# Patient Record
Sex: Male | Born: 1990 | Race: White | Hispanic: No | Marital: Single | Smoking: Never smoker
Health system: Southern US, Community
[De-identification: ages and names within clinical notes are randomized; demographics above are authoritative.]

---
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
## 2010-02-17 ENCOUNTER — Other Ambulatory Visit: Payer: Self-pay | Admitting: Otolaryngology

## 2010-02-17 DIAGNOSIS — R430 Anosmia: Secondary | ICD-10-CM

## 2010-02-21 ENCOUNTER — Ambulatory Visit
Admission: RE | Admit: 2010-02-21 | Discharge: 2010-02-21 | Disposition: A | Discharge status: Home / Self Care | Payer: 59 | Source: Ambulatory Visit | Attending: Otolaryngology | Admitting: Otolaryngology

## 2010-02-21 DIAGNOSIS — R430 Anosmia: Secondary | ICD-10-CM

## 2010-02-21 MED ORDER — GADOBENATE DIMEGLUMINE 529 MG/ML IV SOLN
18.0000 mL | Freq: Once | INTRAVENOUS | Status: AC | PRN
  Administered 2010-02-21: 18 mL via INTRAVENOUS

## 2010-05-18 ENCOUNTER — Emergency Department (HOSPITAL_COMMUNITY): Payer: 59

## 2010-05-18 ENCOUNTER — Emergency Department (HOSPITAL_COMMUNITY)
Admission: EM | Admit: 2010-05-18 | Discharge: 2010-05-18 | Disposition: A | Discharge status: Home / Self Care | Payer: 59 | Attending: Emergency Medicine | Admitting: Emergency Medicine

## 2010-05-18 DIAGNOSIS — IMO0002 Reserved for concepts with insufficient information to code with codable children: Secondary | ICD-10-CM | POA: Insufficient documentation

## 2010-05-18 DIAGNOSIS — R1011 Right upper quadrant pain: Secondary | ICD-10-CM | POA: Insufficient documentation

## 2010-05-18 DIAGNOSIS — Y929 Unspecified place or not applicable: Secondary | ICD-10-CM | POA: Insufficient documentation

## 2010-05-18 DIAGNOSIS — R071 Chest pain on breathing: Secondary | ICD-10-CM | POA: Insufficient documentation

## 2010-05-18 DIAGNOSIS — F848 Other pervasive developmental disorders: Secondary | ICD-10-CM | POA: Insufficient documentation

## 2010-05-18 DIAGNOSIS — Z79899 Other long term (current) drug therapy: Secondary | ICD-10-CM | POA: Insufficient documentation

## 2010-05-18 DIAGNOSIS — F988 Other specified behavioral and emotional disorders with onset usually occurring in childhood and adolescence: Secondary | ICD-10-CM | POA: Insufficient documentation

## 2010-05-18 MED ORDER — IOHEXOL 300 MG/ML  SOLN
100.0000 mL | Freq: Once | INTRAMUSCULAR | Status: AC | PRN
  Administered 2010-05-18: 100 mL via INTRAVENOUS
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
## 2016-03-15 ENCOUNTER — Ambulatory Visit (INDEPENDENT_AMBULATORY_CARE_PROVIDER_SITE_OTHER): Payer: Self-pay

## 2016-03-15 ENCOUNTER — Ambulatory Visit (INDEPENDENT_AMBULATORY_CARE_PROVIDER_SITE_OTHER): Payer: BLUE CROSS/BLUE SHIELD | Admitting: Orthopedic Surgery

## 2016-03-15 ENCOUNTER — Encounter (INDEPENDENT_AMBULATORY_CARE_PROVIDER_SITE_OTHER): Payer: Self-pay | Admitting: Orthopedic Surgery

## 2016-03-15 VITALS — Ht 68.0 in

## 2016-03-15 DIAGNOSIS — M25572 Pain in left ankle and joints of left foot: Secondary | ICD-10-CM | POA: Diagnosis not present

## 2016-03-15 NOTE — Progress Notes (Signed)
   Office Visit Note   Patient: Dustin Green           Date of Birth: 04/26/1990           MRN: [id number] Visit Date: 03/15/2016              Requested by: No referring provider defined for this encounter. PCP: Kaleen MaskELKINS,WILSON OLIVER, MD  Chief Complaint  Patient presents with  . Left Ankle - Pain    Pt states twist injury about 6 months ago    HPI: Pt complains of a almost 6 month history of left ankle pain. He states that this was due to a twist injury in which he states he landed on the ankle and it "bent in a unnatural way". Patient complains of alternating medial and lateral side ankle pain and that it is painful with walking and going up on his toes. He is full weight bearing and in a regular shoe today. [name], RMA  Patient is a 26 year old male seen today for initial evaluation of left ankle pain. States fell down the stairs and twisted his ankle many months ago. Has pain from time to time. No mechanical symptoms. Medial > lateral. No pain today. No difficulty with ambulation.    Assessment & Plan: Visit Diagnoses: No diagnosis found.  Plan: Given an ASO for ambulation. May use ice and ibu for pain. Follow up in office in 4 weeks if continued pain.   Follow-Up Instructions: No Follow-up on file.   Physical Exam  Constitutional: Appears well-developed.  Head: Normocephalic.  Eyes: EOM are normal.  Neck: Normal range of motion.  Cardiovascular: Normal rate.   Pulmonary/Chest: Effort normal.  Neurological: Is alert.  Skin: Skin is warm.  Psychiatric: Has a normal mood and affect. Steady gait. Left Ankle Exam  Swelling: none  Tenderness  The patient is experiencing tenderness in the CF.   Range of Motion  The patient has normal left ankle ROM.   Muscle Strength  The patient has normal left ankle strength.  Tests  Anterior drawer: negative Varus tilt: negative  Other  Erythema: absent Pulse: present  Comments:  No difficulty with single limb  heel raise.       Imaging: No results found.  Labs: No results found for: HGBA1C, ESRSEDRATE, CRP, LABURIC, REPTSTATUS, GRAMSTAIN, CULT, LABORGA  Orders:  No orders of the defined types were placed in this encounter.  No orders of the defined types were placed in this encounter.    Procedures: No procedures performed  Clinical Data: No additional findings.  Subjective: Review of Systems  Constitutional: Negative for chills and fever.  Musculoskeletal: Positive for myalgias. Negative for arthralgias and joint swelling.    Objective: Vital Signs: Ht 5\' 8"  (1.727 m)   Specialty Comments:  No specialty comments available.  PMFS History: There are no active problems to display for this patient.  No past medical history on file.  No family history on file.  No past surgical history on file. Social History   Occupational History  . Not on file.   Social History Main Topics  . Smoking status: Never Smoker  . Smokeless tobacco: Never Used  . Alcohol use Not on file  . Drug use: Unknown  . Sexual activity: Not on file

## 2016-03-22 ENCOUNTER — Ambulatory Visit (INDEPENDENT_AMBULATORY_CARE_PROVIDER_SITE_OTHER): Payer: Self-pay | Admitting: Orthopedic Surgery

## 2020-12-07 ENCOUNTER — Encounter (HOSPITAL_COMMUNITY): Payer: Self-pay

## 2020-12-07 ENCOUNTER — Ambulatory Visit (HOSPITAL_COMMUNITY)
Admission: EM | Admit: 2020-12-07 | Discharge: 2020-12-07 | Disposition: A | Discharge status: Home / Self Care | Payer: Medicaid Other | Attending: Internal Medicine | Admitting: Internal Medicine

## 2020-12-07 DIAGNOSIS — S0502XA Injury of conjunctiva and corneal abrasion without foreign body, left eye, initial encounter: Secondary | ICD-10-CM | POA: Diagnosis not present

## 2020-12-07 MED ORDER — TETRACAINE HCL 0.5 % OP SOLN
OPHTHALMIC | Status: AC
  Filled 2020-12-07: qty 4

## 2020-12-07 MED ORDER — ERYTHROMYCIN 5 MG/GM OP OINT: TOPICAL_OINTMENT | Freq: Three times a day (TID) | OPHTHALMIC | 0 refills | Status: AC

## 2020-12-07 MED ORDER — OLOPATADINE HCL 0.1 % OP SOLN: 1.0000 [drp] | Freq: Two times a day (BID) | OPHTHALMIC | 0 refills | Status: AC

## 2020-12-07 NOTE — ED Triage Notes (Signed)
Patient complains of eye pain with burning.

## 2020-12-07 NOTE — ED Provider Notes (Signed)
[facility]    CSN: [id number] Arrival date & time: 12/07/20  1445      History   Chief Complaint Chief Complaint  Patient presents with   Eye Pain    Eye pain x 2 days    HPI Dustin Green is a 30 y.o. male comes to the urgent care with a couple of days history of eye burning.  Patient's symptoms started abruptly in progressively got worse.  Patient endorses light sensitivity in the left eye.  No blurry vision.  No trauma to the eye.  Patient has some itching in the eye and has been rubbing his eye.  No contact lens use.Marland Kitchen   HPI  History reviewed. No pertinent past medical history.  There are no problems to display for this patient.   History reviewed. No pertinent surgical history.     Home Medications    Prior to Admission medications   Medication Sig Start Date End Date Taking? Authorizing Provider  erythromycin ophthalmic ointment Place into the left eye 3 (three) times daily for 5 days. Place a 1/2 inch ribbon of ointment into the lower eyelid. 12/07/20 12/12/20 Yes Zacari Radick, Britta Mccreedy, MD  olopatadine (PATANOL) 0.1 % ophthalmic solution Place 1 drop into both eyes 2 (two) times daily. 12/07/20  Yes Evony Rezek, Britta Mccreedy, MD    Family History History reviewed. No pertinent family history.  Social History Social History   Tobacco Use   Smoking status: Never   Smokeless tobacco: Never     Allergies   Patient has no known allergies.   Review of Systems Review of Systems  Eyes:  Positive for photophobia, pain, discharge, redness and itching. Negative for visual disturbance.  Respiratory: Negative.      Physical Exam Triage Vital Signs ED Triage Vitals  Enc Vitals Group     BP 12/07/20 1559 (!) 119/92     Pulse Rate 12/07/20 1559 (!) 110     Resp 12/07/20 1559 18     Temp 12/07/20 1559 98.6 F (37 C)     Temp Source 12/07/20 1559 Oral     SpO2 12/07/20 1559 100 %     Weight --      Height --      Head Circumference --      Peak Flow --       Pain Score 12/07/20 1600 10     Pain Loc --      Pain Edu? --      Excl. in GC? --    No data found.  Updated Vital Signs BP (!) 119/92 (BP Location: Left Arm)   Pulse (!) 110   Temp 98.6 F (37 C) (Oral)   Resp 18   SpO2 100%   Visual Acuity Right Eye Distance:   Left Eye Distance:   Bilateral Distance:    Right Eye Near:   Left Eye Near:    Bilateral Near:     Physical Exam Vitals and nursing note reviewed.  Constitutional:      General: He is not in acute distress.    Appearance: He is not ill-appearing.  HENT:     Right Ear: Tympanic membrane normal.     Left Ear: Tympanic membrane normal.  Eyes:     General:        Left eye: Discharge present.    Extraocular Movements: Extraocular movements intact.     Pupils: Pupils are equal, round, and reactive to light.     Comments: Erythema of  the left eye.  Cardiovascular:     Rate and Rhythm: Normal rate and regular rhythm.  Neurological:     Mental Status: He is alert.     UC Treatments / Results  Labs (all labs ordered are listed, but only abnormal results are displayed) Labs Reviewed - No data to display  EKG   Radiology No results found.  Procedures Procedures (including critical care time)  Medications Ordered in UC Medications - No data to display  Initial Impression / Assessment and Plan / UC Course  I have reviewed the triage vital signs and the nursing notes.  Pertinent labs & imaging results that were available during my care of the patient were reviewed by me and considered in my medical decision making (see chart for details).     1.  Abrasion of the left cornea: Fluorescein stain is positive for 1 mm corneal abrasion in the central part of the eye. Mild conjunctival erythema Erythromycin eye ointment twice daily for 5 days Patanol eyedrops twice daily If symptoms worsen please follow-up with ophthalmology for further evaluation. Final Clinical Impressions(s) / UC Diagnoses    Final diagnoses:  Abrasion of left cornea, initial encounter     Discharge Instructions      Please use medications as prescribed Return to urgent care if symptoms worsen.     ED Prescriptions     Medication Sig Dispense Auth. Provider   olopatadine (PATANOL) 0.1 % ophthalmic solution Place 1 drop into both eyes 2 (two) times daily. 5 mL Jaree Dwight, Myrene Galas, MD   erythromycin ophthalmic ointment Place into the left eye 3 (three) times daily for 5 days. Place a 1/2 inch ribbon of ointment into the lower eyelid. 3.5 g Meria Crilly, Myrene Galas, MD      PDMP not reviewed this encounter.   Chase Picket, MD 12/11/20 [phone]

## 2020-12-07 NOTE — Discharge Instructions (Signed)
Please use medications as prescribed Return to urgent care if symptoms worsen.
